# Patient Record
Sex: Female | Born: 1970 | Race: White | Hispanic: No | Marital: Single | Smoking: Former smoker
Health system: Southern US, Community
[De-identification: ages and names within clinical notes are randomized; demographics above are authoritative.]

## PROBLEM LIST (undated history)

## (undated) DIAGNOSIS — F419 Anxiety disorder, unspecified: Secondary | ICD-10-CM

## (undated) DIAGNOSIS — F341 Dysthymic disorder: Secondary | ICD-10-CM

## (undated) DIAGNOSIS — IMO0002 Reserved for concepts with insufficient information to code with codable children: Secondary | ICD-10-CM

## (undated) HISTORY — DX: Reserved for concepts with insufficient information to code with codable children: IMO0002

## (undated) HISTORY — DX: Dysthymic disorder: F34.1

## (undated) HISTORY — PX: TONSILLECTOMY: SUR1361

---

## 1998-04-03 ENCOUNTER — Encounter: Payer: Self-pay | Admitting: Emergency Medicine

## 1998-04-03 ENCOUNTER — Emergency Department (HOSPITAL_COMMUNITY): Admission: EM | Admit: 1998-04-03 | Discharge: 1998-04-03 | Payer: Self-pay | Admitting: Emergency Medicine

## 2003-07-23 ENCOUNTER — Emergency Department (HOSPITAL_COMMUNITY): Admission: EM | Admit: 2003-07-23 | Discharge: 2003-07-23 | Payer: Self-pay | Admitting: Family Medicine

## 2007-06-28 ENCOUNTER — Emergency Department (HOSPITAL_COMMUNITY): Admission: EM | Admit: 2007-06-28 | Discharge: 2007-06-28 | Payer: Self-pay | Admitting: Emergency Medicine

## 2007-08-16 ENCOUNTER — Ambulatory Visit: Payer: Self-pay | Admitting: Gastroenterology

## 2010-07-16 ENCOUNTER — Emergency Department (HOSPITAL_COMMUNITY)
Admission: EM | Admit: 2010-07-16 | Discharge: 2010-07-16 | Disposition: A | Discharge status: Home / Self Care | Payer: Medicaid Other | Attending: Emergency Medicine | Admitting: Emergency Medicine

## 2010-07-16 DIAGNOSIS — R109 Unspecified abdominal pain: Secondary | ICD-10-CM | POA: Insufficient documentation

## 2010-07-16 DIAGNOSIS — K219 Gastro-esophageal reflux disease without esophagitis: Secondary | ICD-10-CM | POA: Insufficient documentation

## 2010-07-16 LAB — DIFFERENTIAL
Basophils Absolute: 0 10*3/uL (ref 0.0–0.1)
Basophils Relative: 0 % (ref 0–1)
Eosinophils Absolute: 0.1 10*3/uL (ref 0.0–0.7)
Eosinophils Relative: 2 % (ref 0–5)
Lymphocytes Relative: 24 % (ref 12–46)
Lymphs Abs: 1.7 10*3/uL (ref 0.7–4.0)
Monocytes Absolute: 0.5 10*3/uL (ref 0.1–1.0)
Monocytes Relative: 7 % (ref 3–12)
Neutro Abs: 4.8 10*3/uL (ref 1.7–7.7)
Neutrophils Relative %: 67 % (ref 43–77)

## 2010-07-16 LAB — CBC
HCT: 31.3 % — ABNORMAL LOW (ref 36.0–46.0)
Hemoglobin: 10.4 g/dL — ABNORMAL LOW (ref 12.0–15.0)
MCH: 28 pg (ref 26.0–34.0)
MCHC: 33.2 g/dL (ref 30.0–36.0)
MCV: 84.1 fL (ref 78.0–100.0)
Platelets: 241 10*3/uL (ref 150–400)
RBC: 3.72 MIL/uL — ABNORMAL LOW (ref 3.87–5.11)
RDW: 14.3 % (ref 11.5–15.5)
WBC: 7.1 10*3/uL (ref 4.0–10.5)

## 2010-07-16 LAB — COMPREHENSIVE METABOLIC PANEL
Albumin: 3.4 g/dL — ABNORMAL LOW (ref 3.5–5.2)
BUN: 12 mg/dL (ref 6–23)
Chloride: 106 mEq/L (ref 96–112)
Creatinine, Ser: 0.66 mg/dL (ref 0.4–1.2)
Total Bilirubin: <0.1 mg/dL — ABNORMAL LOW (ref 0.3–1.2)
Total Protein: 6.3 g/dL (ref 6.0–8.3)

## 2010-07-16 LAB — POCT CARDIAC MARKERS
CKMB, poc: <1 ng/mL — ABNORMAL LOW (ref 1.0–8.0)
Myoglobin, poc: 36.4 ng/mL (ref 12–200)
Troponin i, poc: <0.05 ng/mL (ref 0.00–0.09)

## 2010-07-29 LAB — ABO/RH: RH Type: NEGATIVE

## 2010-07-29 LAB — RPR: RPR: NONREACTIVE

## 2010-07-29 LAB — HIV ANTIBODY (ROUTINE TESTING W REFLEX): HIV: NONREACTIVE

## 2010-11-15 LAB — HIV ANTIBODY (ROUTINE TESTING W REFLEX): HIV: NONREACTIVE

## 2010-11-15 LAB — RPR: RPR: NONREACTIVE

## 2011-01-31 ENCOUNTER — Other Ambulatory Visit (HOSPITAL_COMMUNITY): Payer: Self-pay | Admitting: Obstetrics and Gynecology

## 2011-02-02 ENCOUNTER — Ambulatory Visit (HOSPITAL_COMMUNITY)
Admission: RE | Admit: 2011-02-02 | Discharge: 2011-02-02 | Disposition: A | Discharge status: Home / Self Care | Payer: Medicaid Other | Source: Ambulatory Visit | Attending: Obstetrics and Gynecology | Admitting: Obstetrics and Gynecology

## 2011-02-02 DIAGNOSIS — O365990 Maternal care for other known or suspected poor fetal growth, unspecified trimester, not applicable or unspecified: Secondary | ICD-10-CM | POA: Insufficient documentation

## 2011-02-02 DIAGNOSIS — O09529 Supervision of elderly multigravida, unspecified trimester: Secondary | ICD-10-CM | POA: Insufficient documentation

## 2011-02-10 ENCOUNTER — Inpatient Hospital Stay (HOSPITAL_COMMUNITY)
Admission: AD | Admit: 2011-02-10 | Discharge: 2011-02-10 | Disposition: A | Discharge status: Home / Self Care | Payer: Medicaid Other | Source: Ambulatory Visit | Attending: Obstetrics and Gynecology | Admitting: Obstetrics and Gynecology

## 2011-02-10 ENCOUNTER — Encounter (HOSPITAL_COMMUNITY): Payer: Self-pay | Admitting: *Deleted

## 2011-02-10 DIAGNOSIS — O479 False labor, unspecified: Secondary | ICD-10-CM | POA: Insufficient documentation

## 2011-02-10 HISTORY — DX: Anxiety disorder, unspecified: F41.9

## 2011-02-10 NOTE — Progress Notes (Signed)
I've been contracting off and on all day. Had NST in office and had some ctxs then. Have gotten closer and alittle stronger last hr or so

## 2011-02-10 NOTE — Progress Notes (Signed)
2150 Dr Ellyn Hack notified of pt's admission and status. Aware of ctx pattern, sve, hx NSTs due to baby measuring small but u/s ok. Pt stable for d/c home

## 2011-02-10 NOTE — Progress Notes (Signed)
Written and verbal d/c instructions given for false labor and understanding voiced

## 2011-02-13 ENCOUNTER — Inpatient Hospital Stay (HOSPITAL_COMMUNITY)
Admission: AD | Admit: 2011-02-13 | Discharge: 2011-02-13 | Disposition: A | Discharge status: Home / Self Care | Payer: Medicaid Other | Source: Ambulatory Visit | Attending: Obstetrics and Gynecology | Admitting: Obstetrics and Gynecology

## 2011-02-13 ENCOUNTER — Encounter (HOSPITAL_COMMUNITY): Payer: Self-pay

## 2011-02-13 DIAGNOSIS — O479 False labor, unspecified: Secondary | ICD-10-CM | POA: Insufficient documentation

## 2011-02-13 NOTE — Progress Notes (Signed)
Pt states contracting 5-6 minutes apart starting at 1900 tonight. States they are not as frequent or as strong since she;s been here in [facility].

## 2011-02-13 NOTE — Progress Notes (Signed)
Pt reports contractions, G2P1, denies problems with preg. Denies bleeding or ROM

## 2011-02-16 LAB — DIFFERENTIAL
Basophils Absolute: 0
Eosinophils Absolute: 0.1
Eosinophils Relative: 1
Monocytes Absolute: 0.4

## 2011-02-16 LAB — COMPREHENSIVE METABOLIC PANEL
ALT: 16
AST: 16
Albumin: 3.9
CO2: 26
Chloride: 109
GFR calc Af Amer: >60
GFR calc non Af Amer: >60
Potassium: 4.4
Sodium: 140
Total Bilirubin: 0.8

## 2011-02-16 LAB — URINALYSIS, ROUTINE W REFLEX MICROSCOPIC
Hgb urine dipstick: NEGATIVE
Nitrite: NEGATIVE
Specific Gravity, Urine: 1.019
Urobilinogen, UA: 1

## 2011-02-16 LAB — CBC
RBC: 4.42
WBC: 6.8

## 2011-02-16 LAB — LIPASE, BLOOD: Lipase: 22

## 2011-02-17 ENCOUNTER — Telehealth (HOSPITAL_COMMUNITY): Payer: Self-pay | Admitting: *Deleted

## 2011-02-17 ENCOUNTER — Encounter (HOSPITAL_COMMUNITY): Payer: Self-pay | Admitting: *Deleted

## 2011-02-17 NOTE — Telephone Encounter (Signed)
Preadmission screen  

## 2011-02-20 ENCOUNTER — Encounter (HOSPITAL_COMMUNITY): Payer: Self-pay | Admitting: *Deleted

## 2011-02-20 ENCOUNTER — Other Ambulatory Visit: Payer: Self-pay | Admitting: Obstetrics and Gynecology

## 2011-02-21 ENCOUNTER — Other Ambulatory Visit: Payer: Self-pay | Admitting: Obstetrics and Gynecology

## 2011-02-21 ENCOUNTER — Inpatient Hospital Stay (HOSPITAL_COMMUNITY): Payer: Medicaid Other | Admitting: Anesthesiology

## 2011-02-21 ENCOUNTER — Encounter (HOSPITAL_COMMUNITY)
Admission: RE | Disposition: A | Discharge status: Home / Self Care | Payer: Self-pay | Source: Ambulatory Visit | Attending: Obstetrics and Gynecology

## 2011-02-21 ENCOUNTER — Encounter (HOSPITAL_COMMUNITY): Payer: Self-pay

## 2011-02-21 ENCOUNTER — Encounter (HOSPITAL_COMMUNITY): Payer: Self-pay | Admitting: Anesthesiology

## 2011-02-21 ENCOUNTER — Inpatient Hospital Stay (HOSPITAL_COMMUNITY)
Admission: RE | Admit: 2011-02-21 | Discharge: 2011-02-23 | DRG: 767 | Disposition: A | Discharge status: Home / Self Care | Payer: Medicaid Other | Source: Ambulatory Visit | Attending: Obstetrics and Gynecology | Admitting: Obstetrics and Gynecology

## 2011-02-21 VITALS — BP 148/88 | HR 76 | Temp 97.8°F | Resp 18 | Ht 71.0 in | Wt 174.0 lb

## 2011-02-21 DIAGNOSIS — N909 Noninflammatory disorder of vulva and perineum, unspecified: Secondary | ICD-10-CM | POA: Diagnosis present

## 2011-02-21 DIAGNOSIS — Z9851 Tubal ligation status: Secondary | ICD-10-CM

## 2011-02-21 DIAGNOSIS — O99892 Other specified diseases and conditions complicating childbirth: Principal | ICD-10-CM | POA: Diagnosis present

## 2011-02-21 DIAGNOSIS — Z302 Encounter for sterilization: Secondary | ICD-10-CM

## 2011-02-21 HISTORY — PX: TUBAL LIGATION: SHX77

## 2011-02-21 LAB — RPR: RPR Ser Ql: NONREACTIVE

## 2011-02-21 LAB — ABO/RH: ABO/RH(D): A NEG

## 2011-02-21 LAB — SURGICAL PCR SCREEN
MRSA, PCR: NEGATIVE
Staphylococcus aureus: POSITIVE — AB

## 2011-02-21 LAB — CBC
MCH: 25.8 pg — ABNORMAL LOW (ref 26.0–34.0)
MCHC: 33.2 g/dL (ref 30.0–36.0)
Platelets: 327 10*3/uL (ref 150–400)
RDW: 15.2 % (ref 11.5–15.5)

## 2011-02-21 SURGERY — LIGATION, FALLOPIAN TUBE, POSTPARTUM
Anesthesia: Spinal | Site: Abdomen | Laterality: Bilateral | Wound class: Clean

## 2011-02-21 MED ORDER — LIDOCAINE HCL (PF) 1 % IJ SOLN
30.0000 mL | INTRAMUSCULAR | Status: DC | PRN
  Administered 2011-02-21: 30 mL via SUBCUTANEOUS
  Filled 2011-02-21: qty 30

## 2011-02-21 MED ORDER — INFLUENZA VIRUS VACC SPLIT PF IM SUSP
0.5000 mL | Freq: Once | INTRAMUSCULAR | Status: AC
  Administered 2011-02-22: 0.5 mL via INTRAMUSCULAR
  Filled 2011-02-21: qty 0.5

## 2011-02-21 MED ORDER — BUTORPHANOL TARTRATE 2 MG/ML IJ SOLN
INTRAMUSCULAR | Status: AC
  Administered 2011-02-21: 1 mg via INTRAVENOUS
  Filled 2011-02-21: qty 1

## 2011-02-21 MED ORDER — PHENYLEPHRINE 40 MCG/ML (10ML) SYRINGE FOR IV PUSH (FOR BLOOD PRESSURE SUPPORT): 80.0000 ug | PREFILLED_SYRINGE | INTRAVENOUS | Status: DC | PRN

## 2011-02-21 MED ORDER — INFLUENZA VIRUS VACC SPLIT PF IM SUSP
0.5000 mL | INTRAMUSCULAR | Status: AC | PRN
  Administered 2011-02-22: 0.5 mL via INTRAMUSCULAR

## 2011-02-21 MED ORDER — LACTATED RINGERS IV SOLN
INTRAVENOUS | Status: DC
  Administered 2011-02-21 (×2): via INTRAVENOUS

## 2011-02-21 MED ORDER — TETANUS-DIPHTH-ACELL PERTUSSIS 5-2.5-18.5 LF-MCG/0.5 IM SUSP
0.5000 mL | Freq: Once | INTRAMUSCULAR | Status: AC
  Administered 2011-02-22: 0.5 mL via INTRAMUSCULAR
  Filled 2011-02-21 (×2): qty 0.5

## 2011-02-21 MED ORDER — LANOLIN HYDROUS EX OINT: TOPICAL_OINTMENT | CUTANEOUS | Status: DC | PRN

## 2011-02-21 MED ORDER — LACTATED RINGERS IV SOLN: 500.0000 mL | Freq: Once | INTRAVENOUS | Status: DC

## 2011-02-21 MED ORDER — BUTORPHANOL TARTRATE 2 MG/ML IJ SOLN
1.0000 mg | Freq: Once | INTRAMUSCULAR | Status: AC
  Administered 2011-02-21: 1 mg via INTRAVENOUS

## 2011-02-21 MED ORDER — PHENYLEPHRINE 40 MCG/ML (10ML) SYRINGE FOR IV PUSH (FOR BLOOD PRESSURE SUPPORT)
PREFILLED_SYRINGE | INTRAVENOUS | Status: AC
  Filled 2011-02-21: qty 5

## 2011-02-21 MED ORDER — METOCLOPRAMIDE HCL 10 MG PO TABS
10.0000 mg | ORAL_TABLET | Freq: Once | ORAL | Status: AC
  Administered 2011-02-21: 10 mg via ORAL
  Filled 2011-02-21: qty 1

## 2011-02-21 MED ORDER — TERBUTALINE SULFATE 1 MG/ML IJ SOLN: 0.2500 mg | Freq: Once | INTRAMUSCULAR | Status: DC | PRN

## 2011-02-21 MED ORDER — NALOXONE HCL 0.4 MG/ML IJ SOLN
INTRAMUSCULAR | Status: AC
  Filled 2011-02-21: qty 1

## 2011-02-21 MED ORDER — ONDANSETRON HCL 4 MG/2ML IJ SOLN
INTRAMUSCULAR | Status: AC
  Filled 2011-02-21: qty 2

## 2011-02-21 MED ORDER — FAMOTIDINE 20 MG PO TABS
40.0000 mg | ORAL_TABLET | Freq: Once | ORAL | Status: AC
  Administered 2011-02-21: 40 mg via ORAL
  Filled 2011-02-21: qty 2

## 2011-02-21 MED ORDER — DIBUCAINE 1 % RE OINT
1.0000 "application " | TOPICAL_OINTMENT | RECTAL | Status: DC | PRN
  Administered 2011-02-21: 1 via RECTAL
  Filled 2011-02-21 (×2): qty 28

## 2011-02-21 MED ORDER — BENZOCAINE-MENTHOL 20-0.5 % EX AERO
1.0000 "application " | INHALATION_SPRAY | CUTANEOUS | Status: DC | PRN
  Administered 2011-02-21: 1 via TOPICAL
  Filled 2011-02-21: qty 56

## 2011-02-21 MED ORDER — MIDAZOLAM HCL 2 MG/2ML IJ SOLN
INTRAMUSCULAR | Status: AC
  Filled 2011-02-21: qty 2

## 2011-02-21 MED ORDER — LACTATED RINGERS IV SOLN
INTRAVENOUS | Status: DC
  Administered 2011-02-21: 999 mL/h via INTRAVENOUS
  Administered 2011-02-21: 125 mL/h via INTRAVENOUS

## 2011-02-21 MED ORDER — ONDANSETRON HCL 4 MG/2ML IJ SOLN: 4.0000 mg | INTRAMUSCULAR | Status: DC | PRN

## 2011-02-21 MED ORDER — SIMETHICONE 80 MG PO CHEW: 80.0000 mg | CHEWABLE_TABLET | ORAL | Status: DC | PRN

## 2011-02-21 MED ORDER — MEPERIDINE HCL 25 MG/ML IJ SOLN: 6.2500 mg | INTRAMUSCULAR | Status: DC | PRN

## 2011-02-21 MED ORDER — DIPHENHYDRAMINE HCL 50 MG/ML IJ SOLN: 12.5000 mg | INTRAMUSCULAR | Status: DC | PRN

## 2011-02-21 MED ORDER — BUPIVACAINE IN DEXTROSE 0.75-8.25 % IT SOLN
INTRATHECAL | Status: DC | PRN
  Administered 2011-02-21: 15 mg via INTRATHECAL

## 2011-02-21 MED ORDER — EPHEDRINE 5 MG/ML INJ: 10.0000 mg | INTRAVENOUS | Status: DC | PRN

## 2011-02-21 MED ORDER — METOCLOPRAMIDE HCL 5 MG/ML IJ SOLN: 10.0000 mg | Freq: Once | INTRAMUSCULAR | Status: DC | PRN

## 2011-02-21 MED ORDER — FENTANYL CITRATE 0.05 MG/ML IJ SOLN: 25.0000 ug | INTRAMUSCULAR | Status: DC | PRN

## 2011-02-21 MED ORDER — OXYTOCIN BOLUS FROM INFUSION
500.0000 mL | Freq: Once | INTRAVENOUS | Status: DC
  Filled 2011-02-21: qty 500

## 2011-02-21 MED ORDER — IBUPROFEN 600 MG PO TABS
600.0000 mg | ORAL_TABLET | Freq: Four times a day (QID) | ORAL | Status: DC
  Administered 2011-02-21 – 2011-02-23 (×8): 600 mg via ORAL
  Filled 2011-02-21 (×8): qty 1

## 2011-02-21 MED ORDER — WITCH HAZEL-GLYCERIN EX PADS
1.0000 "application " | MEDICATED_PAD | CUTANEOUS | Status: DC | PRN
  Administered 2011-02-21: 1 via TOPICAL

## 2011-02-21 MED ORDER — OXYTOCIN 20 UNITS IN LACTATED RINGERS INFUSION - SIMPLE
1.0000 m[IU]/min | INTRAVENOUS | Status: DC
  Administered 2011-02-21: 4 m[IU]/min via INTRAVENOUS
  Administered 2011-02-21: 2 m[IU]/min via INTRAVENOUS
  Filled 2011-02-21: qty 1000

## 2011-02-21 MED ORDER — FLEET ENEMA 7-19 GM/118ML RE ENEM: 1.0000 | ENEMA | RECTAL | Status: DC | PRN

## 2011-02-21 MED ORDER — FENTANYL 2.5 MCG/ML BUPIVACAINE 1/10 % EPIDURAL INFUSION (WH - ANES): 14.0000 mL/h | INTRAMUSCULAR | Status: DC

## 2011-02-21 MED ORDER — ACETAMINOPHEN 325 MG PO TABS: 650.0000 mg | ORAL_TABLET | ORAL | Status: DC | PRN

## 2011-02-21 MED ORDER — PRENATAL PLUS 27-1 MG PO TABS
1.0000 | ORAL_TABLET | Freq: Every day | ORAL | Status: DC
  Administered 2011-02-22 – 2011-02-23 (×2): 1 via ORAL
  Filled 2011-02-21 (×2): qty 1

## 2011-02-21 MED ORDER — ONDANSETRON HCL 4 MG/2ML IJ SOLN: 4.0000 mg | Freq: Four times a day (QID) | INTRAMUSCULAR | Status: DC | PRN

## 2011-02-21 MED ORDER — MIDAZOLAM HCL 5 MG/5ML IJ SOLN
INTRAMUSCULAR | Status: DC | PRN
  Administered 2011-02-21: 2 mg via INTRAVENOUS

## 2011-02-21 MED ORDER — OXYTOCIN 20 UNITS IN LACTATED RINGERS INFUSION - SIMPLE: 125.0000 mL/h | Freq: Once | INTRAVENOUS | Status: DC

## 2011-02-21 MED ORDER — ZOLPIDEM TARTRATE 5 MG PO TABS: 5.0000 mg | ORAL_TABLET | Freq: Every evening | ORAL | Status: DC | PRN

## 2011-02-21 MED ORDER — EPHEDRINE 5 MG/ML INJ
10.0000 mg | INTRAVENOUS | Status: DC | PRN
  Filled 2011-02-21: qty 4

## 2011-02-21 MED ORDER — FENTANYL CITRATE 0.05 MG/ML IJ SOLN
INTRAMUSCULAR | Status: DC | PRN
  Administered 2011-02-21 (×2): 50 ug via INTRAVENOUS

## 2011-02-21 MED ORDER — ONDANSETRON HCL 4 MG PO TABS: 4.0000 mg | ORAL_TABLET | ORAL | Status: DC | PRN

## 2011-02-21 MED ORDER — SENNOSIDES-DOCUSATE SODIUM 8.6-50 MG PO TABS
2.0000 | ORAL_TABLET | Freq: Every day | ORAL | Status: DC
  Administered 2011-02-22: 2 via ORAL

## 2011-02-21 MED ORDER — FENTANYL CITRATE 0.05 MG/ML IJ SOLN
INTRAMUSCULAR | Status: AC
  Filled 2011-02-21: qty 2

## 2011-02-21 MED ORDER — OXYCODONE-ACETAMINOPHEN 5-325 MG PO TABS
1.0000 | ORAL_TABLET | ORAL | Status: DC | PRN
  Administered 2011-02-21: 1 via ORAL
  Filled 2011-02-21: qty 1

## 2011-02-21 MED ORDER — FENTANYL 2.5 MCG/ML BUPIVACAINE 1/10 % EPIDURAL INFUSION (WH - ANES)
INTRAMUSCULAR | Status: AC
  Filled 2011-02-21: qty 60

## 2011-02-21 MED ORDER — IBUPROFEN 600 MG PO TABS: 600.0000 mg | ORAL_TABLET | Freq: Four times a day (QID) | ORAL | Status: DC | PRN

## 2011-02-21 MED ORDER — DIPHENHYDRAMINE HCL 25 MG PO CAPS: 25.0000 mg | ORAL_CAPSULE | Freq: Four times a day (QID) | ORAL | Status: DC | PRN

## 2011-02-21 MED ORDER — BENZOCAINE-MENTHOL 20-0.5 % EX AERO
INHALATION_SPRAY | CUTANEOUS | Status: AC
  Administered 2011-02-21: 1 via TOPICAL
  Filled 2011-02-21: qty 56

## 2011-02-21 MED ORDER — CITRIC ACID-SODIUM CITRATE 334-500 MG/5ML PO SOLN: 30.0000 mL | ORAL | Status: DC | PRN

## 2011-02-21 MED ORDER — LACTATED RINGERS IV SOLN: 500.0000 mL | INTRAVENOUS | Status: DC | PRN

## 2011-02-21 MED ORDER — EPHEDRINE 5 MG/ML INJ
INTRAVENOUS | Status: AC
  Filled 2011-02-21: qty 4

## 2011-02-21 MED ORDER — OXYCODONE-ACETAMINOPHEN 5-325 MG PO TABS: 2.0000 | ORAL_TABLET | ORAL | Status: DC | PRN

## 2011-02-21 MED ORDER — ONDANSETRON HCL 4 MG/2ML IJ SOLN
INTRAMUSCULAR | Status: DC | PRN
  Administered 2011-02-21: 4 mg via INTRAVENOUS

## 2011-02-21 SURGICAL SUPPLY — 22 items
CLOTH BEACON ORANGE TIMEOUT ST (SAFETY) ×2 IMPLANT
CONTAINER PREFILL 10% NBF 15ML (MISCELLANEOUS) ×4 IMPLANT
DRSG COVADERM PLUS 2X2 (GAUZE/BANDAGES/DRESSINGS) ×2 IMPLANT
ELECT REM PT RETURN 9FT ADLT (ELECTROSURGICAL) ×2
ELECTRODE REM PT RTRN 9FT ADLT (ELECTROSURGICAL) ×1 IMPLANT
GLOVE BIO SURGEON STRL SZ7.5 (GLOVE) ×4 IMPLANT
GOWN PREVENTION PLUS LG XLONG (DISPOSABLE) ×2 IMPLANT
GOWN PREVENTION PLUS XLARGE (GOWN DISPOSABLE) ×2 IMPLANT
NS IRRIG 1000ML POUR BTL (IV SOLUTION) ×2 IMPLANT
PACK ABDOMINAL MINOR (CUSTOM PROCEDURE TRAY) ×2 IMPLANT
PENCIL BUTTON HOLSTER BLD 10FT (ELECTRODE) ×2 IMPLANT
SPONGE LAP 4X18 X RAY DECT (DISPOSABLE) ×2 IMPLANT
SUT PLAIN 0 NONE (SUTURE) ×4 IMPLANT
SUT PLAIN 2 0 (SUTURE)
SUT PLAIN 3 0 X 1 18 (SUTURE) ×2 IMPLANT
SUT PLAIN ABS 2-0 54XMFL TIE (SUTURE) IMPLANT
SUT VIC AB 0 CT1 27 (SUTURE) ×1
SUT VIC AB 0 CT1 27XBRD ANBCTR (SUTURE) ×1 IMPLANT
SUT VIC AB 3-0 CTX 36 (SUTURE) IMPLANT
TOWEL OR 17X24 6PK STRL BLUE (TOWEL DISPOSABLE) ×4 IMPLANT
TRAY FOLEY CATH 14FR (SET/KITS/TRAYS/PACK) ×2 IMPLANT
WATER STERILE IRR 1000ML POUR (IV SOLUTION) ×2 IMPLANT

## 2011-02-21 NOTE — Anesthesia Postprocedure Evaluation (Signed)
Anesthesia Post Note  Patient: Savannah Webb  Procedure(s) Performed:  POST PARTUM TUBAL LIGATION  Anesthesia type: Spinal  Patient location: PACU  Post pain: Pain level controlled  Post assessment: Post-op Vital signs reviewed  Last Vitals:  Filed Vitals:   02/21/11 1930  BP: 116/67  Pulse: 94  Temp:   Resp:     Post vital signs: Reviewed  Level of consciousness: awake  Complications: No apparent anesthesia complications

## 2011-02-21 NOTE — Progress Notes (Signed)
Patient reached full dilatation and pushed poorly. Finally, a LML episiotomy was done and with one contraction she delivered a living fermale infant 7 pounds 0 ounces OP. The placenta was removed intact and the uterus was normal. The pt requested removal of 3 lesions on the left labium majus.1% xylocaine was used for the LML episiotomy and the left labial lesions. The LML episiotomy was repaired with 3-0 vicryl. EBL 500 cc's.

## 2011-02-21 NOTE — Anesthesia Preprocedure Evaluation (Signed)
Anesthesia Evaluation  Name, MR# and DOB Patient awake  General Assessment Comment  Reviewed: Allergy & Precautions, H&P , Patient's Chart, lab work & pertinent test results  Airway Mallampati: II TM Distance: >3 FB Neck ROM: full    Dental No notable dental hx. (+) Teeth Intact   Pulmonary  clear to auscultation  pulmonary exam normalPulmonary Exam Normal breath sounds clear to auscultation none    Cardiovascular regular Normal    Neuro/Psych    (+) Anxiety,  Negative Neurological ROS  Negative Psych ROS  GI/Hepatic/Renal negative GI ROS  negative Liver ROS  negative Renal ROS        Endo/Other  Negative Endocrine ROS (+)      Abdominal Normal abdominal exam  (+)   Musculoskeletal negative musculoskeletal ROS (+)   Hematology negative hematology ROS (+)   Peds  Reproductive/Obstetrics negative OB ROS    Anesthesia Other Findings             Anesthesia Physical  Anesthesia Plan  ASA: II  Anesthesia Plan: Epidural   Post-op Pain Management:    Induction:   Airway Management Planned:   Additional Equipment:   Intra-op Plan:   Post-operative Plan:   Informed Consent: I have reviewed the patients History and Physical, chart, labs and discussed the procedure including the risks, benefits and alternatives for the proposed anesthesia with the patient or authorized representative who has indicated his/her understanding and acceptance.   Dental advisory given  Plan Discussed with: Anesthesiologist and CRNA  Anesthesia Plan Comments:         Anesthesia Quick Evaluation

## 2011-02-21 NOTE — Anesthesia Procedure Notes (Signed)
Spinal Block  Patient location during procedure: OR Start time: 02/21/2011 6:40 PM Staffing Anesthesiologist: Maeby Vankleeck A. Performed by: anesthesiologist  Preanesthetic Checklist Completed: patient identified, site marked, surgical consent, pre-op evaluation, timeout performed, IV checked, risks and benefits discussed and monitors and equipment checked Spinal Block Patient position: sitting Prep: site prepped and draped and DuraPrep Patient monitoring: heart rate, cardiac monitor, continuous pulse ox and blood pressure Approach: midline Location: L3-4 Injection technique: single-shot Needle Needle type: Sprotte  Needle gauge: 24 G Needle length: 9 cm Needle insertion depth: 6 cm Assessment Sensory level: T4 Additional Notes Patient tolerated procedure well. Sensory level at T6 to cold.

## 2011-02-21 NOTE — Anesthesia Preprocedure Evaluation (Deleted)
Anesthesia Evaluation  Name, MR# and DOB Patient awake  General Assessment Comment  Reviewed: Allergy & Precautions, H&P , Patient's Chart, lab work & pertinent test results  Airway Mallampati: II TM Distance: >3 FB Neck ROM: full    Dental No notable dental hx. (+) Teeth Intact   Pulmonary  clear to auscultation  pulmonary exam normalPulmonary Exam Normal breath sounds clear to auscultation none    Cardiovascular regular Normal    Neuro/Psych Negative Neurological ROS  Negative Psych ROS  GI/Hepatic/Renal negative GI ROS  negative Liver ROS  negative Renal ROS        Endo/Other  Negative Endocrine ROS (+)      Abdominal   Musculoskeletal   Hematology negative hematology ROS (+)   Peds  Reproductive/Obstetrics (+) Pregnancy    Anesthesia Other Findings            Anesthesia Physical Anesthesia Plan  ASA: II  Anesthesia Plan: Epidural   Post-op Pain Management:    Induction:   Airway Management Planned:   Additional Equipment:   Intra-op Plan:   Post-operative Plan:   Informed Consent: I have reviewed the patients History and Physical, chart, labs and discussed the procedure including the risks, benefits and alternatives for the proposed anesthesia with the patient or authorized representative who has indicated his/her understanding and acceptance.     Plan Discussed with: Anesthesiologist  Anesthesia Plan Comments:         Anesthesia Quick Evaluation  

## 2011-02-21 NOTE — H&P (Signed)
Dr. Ambrose Mantle dictating an admission history and physical exam on Savannah Webb:  This is a 40 year old white female para 1001 gravida 2 EDC 02/25/2011 based on ultrasound on 07/27/2010 at 9 weeks and 6 days.  Blood group and type is A- negative antibody, Pap smear normal, rubella non-immune, urine culture negative, hepatitis B surface antigen negative, HIV negative, GC and Chlamydia negative first trimester screen and AFP negative cystic fibrosis screen negative one hour Glucola 78 group B strep negative  The patient's prenatal course has been uncomplicated she has requested tubal ligation postpartum and I will check with her to see if she is signed her papers. She is now at 39+ weeks gestation the cervix was favorable and she is admitted for induction of labor  Past medical history allergies: None known  Operations tonsillectomy 2006  Medical history: Generalized anxiety disorder depression panic disorder alcohol tobacco and drugs: None  Family history: Maternal grandmother heart disease and colon cancer, mother high blood pressure uncle high blood pressure and diabetes  Obstetric history: 12/25/1995 7 lbs. 2 oz. Female infant vaginally with an epidural.  Medications: Prenatal vitamins  Physical exam: Blood pressure 121/68 pulse 68 respirations 20  Heart: Normal sinus rhythm no murmurs  Lungs: Clear to auscultation  Abdomen: Fundal height 36 cm fetal heart tones normal  Cervix 2 cm, 50% effaced, vertex at a -3  Impression: Intrauterine pregnancy 39+ weeks  Disposition: IV Pitocin, observe for progress in labor

## 2011-02-21 NOTE — Transfer of Care (Signed)
Immediate Anesthesia Transfer of Care Note  Patient: Savannah Webb  Procedure(s) Performed:  POST PARTUM TUBAL LIGATION  Patient Location: PACU  Anesthesia Type: Spinal  Level of Consciousness: awake, alert , oriented and patient cooperative  Airway & Oxygen Therapy: Patient Spontanous Breathing  Post-op Assessment: Report given to PACU RN and Post -op Vital signs reviewed and stable  Post vital signs: Reviewed and stable  Complications: No apparent anesthesia complications

## 2011-02-21 NOTE — Progress Notes (Signed)
Pitocin is at 4 mu/ minute and the contractions are q 3 minutes but not painful. Cervix 2 cm 50 % effaced and the vertex is at - 2/-3 station. AROM produced clear fluid.

## 2011-02-21 NOTE — Progress Notes (Signed)
#  1 afebrile BP slightly elevated HGB stable no complaints.

## 2011-02-22 LAB — CBC
MCH: 24.9 pg — ABNORMAL LOW (ref 26.0–34.0)
MCHC: 31.7 g/dL (ref 30.0–36.0)
Platelets: 325 10*3/uL (ref 150–400)
RDW: 15.4 % (ref 11.5–15.5)

## 2011-02-22 MED ORDER — RHO D IMMUNE GLOBULIN 1500 UNIT/2ML IJ SOLN
300.0000 ug | Freq: Once | INTRAMUSCULAR | Status: AC
  Administered 2011-02-22: 300 ug via INTRAMUSCULAR
  Filled 2011-02-22: qty 2

## 2011-02-22 NOTE — Progress Notes (Signed)
Encounter addended by: Madison Hickman on: 02/22/2011 12:18 PM<BR>     Documentation filed: Notes Section

## 2011-02-22 NOTE — Progress Notes (Signed)
UR chart review completed.  

## 2011-02-22 NOTE — Anesthesia Postprocedure Evaluation (Deleted)
  Anesthesia Post-op Note  Patient: Savannah Webb  Procedure(s) Performed: * None*  Patient Location: Women[facility] Unit  Anesthesia Type: Patient was fully dilated. No procedure performed

## 2011-02-22 NOTE — Progress Notes (Signed)
Patient states she has been having slightly blurry vision and seeing a few spots for about five minutes and complains of mild headache for an hour. Patient states headache decreased with turning down the lights and resting.  No other symptoms present.  BP 136/85.   Dr. Jackelyn Knife notified with no new orders given.  Will continue to monitor pt closely.  Savannah Webb, [name]

## 2011-02-22 NOTE — Anesthesia Postprocedure Evaluation (Signed)
  Anesthesia Post-op Note  Patient: Savannah Webb  Procedure(s) Performed:  POST PARTUM TUBAL LIGATION  Patient Location: PACU and Women's Unit  Anesthesia Type: General  Level of Consciousness: awake, alert  and oriented  Airway and Oxygen Therapy: Patient Spontanous Breathing  Post-op Pain: none  Post-op Assessment: Post-op Vital signs reviewed and Patient's Cardiovascular Status Stable  Post-op Vital Signs: Reviewed and stable  Complications: No apparent anesthesia complications

## 2011-02-22 NOTE — Op Note (Signed)
NAMEMarland Kitchen  Savannah Webb, Savannah Webb NO.:  [id number]  MEDICAL RECORD NO.:  [id number]  LOCATION:  9101                          FACILITY:  [facility]  PHYSICIAN:  Malachi Pro. Ambrose Mantle, M.D. DATE OF BIRTH:  August 09, 1970  DATE OF PROCEDURE:  02/21/2011 DATE OF DISCHARGE:                              OPERATIVE REPORT   PREOPERATIVE DIAGNOSIS:  Voluntary sterilization.  POSTOPERATIVE DIAGNOSIS:  Voluntary sterilization.  OPERATION:  Bilateral tubal ligation.  OPERATOR:  Malachi Pro. Ambrose Mantle, MD, spinal anesthesia by Dr. Malen Gauze.  This patient was brought to the operating room after having been thoroughly counseled about the potential down side of doing a tubal ligation.  She had signed her sterilization forms.  She was given a spinal anesthetic by Dr. Malen Gauze, placed supine.  The abdomen was prepped with Betadine solution.  The urethra was prepped, and a Foley catheter was inserted to straight drain.  The abdomen was then draped as a sterile field.  Anesthesia was confirmed.  A semilunar incision was made in the inferior portion of the umbilicus through the skin.  The subcutaneous tissue was dissected to the fascia.  The fascia was opened and the peritoneum was entered without difficulty.  Each tube was identified and traced to its fimbriated end.  I could feel both ovaries, but I could not see them.  I identified both tubes and traced them to their fimbria, created a small window in the mesosalpinx placed two ties of 0 plain catgut proximally and distally on each tube, cut out the intervening section of tube.  There was no bleeding.  The suture was cut and the abdominal wall was closed with two interrupted figure-of-eight sutures of 0 Vicryl including the fascia, and the peritoneum, two sutures on the subcu tissue of 3-0 Vicryl, and the skin was reapproximated with 3-0 plain catgut.  The patient seemed to tolerate the procedure well.  Blood loss was no more than 2 or 3 mL.  Sponge and needle  counts were correct.  The patient was returned to recovery in satisfactory condition.     Malachi Pro. Ambrose Mantle, M.D.     TFH/MEDQ  D:  02/21/2011  T:  02/22/2011  Job:  [id number]

## 2011-02-23 LAB — RH IG WORKUP (INCLUDES ABO/RH)
ABO/RH(D): A NEG
Gestational Age(Wks): 39.3
Unit division: 0

## 2011-02-23 MED ORDER — IBUPROFEN 600 MG PO TABS: 600.0000 mg | ORAL_TABLET | Freq: Four times a day (QID) | ORAL | Status: AC | PRN

## 2011-02-23 MED ORDER — MEASLES, MUMPS & RUBELLA VAC ~~LOC~~ INJ
0.5000 mL | INJECTION | Freq: Once | SUBCUTANEOUS | Status: AC
  Administered 2011-02-23: 0.5 mL via SUBCUTANEOUS
  Filled 2011-02-23: qty 0.5

## 2011-02-23 NOTE — Progress Notes (Signed)
#  2 afebrile no problems for D/C.  

## 2011-02-23 NOTE — Discharge Summary (Signed)
NAMEMarland Kitchen  CHANTI, GOLUBSKI NO.:  [id number]  MEDICAL RECORD NO.:  [id number]  LOCATION:  9101                          FACILITY:  [facility]  PHYSICIAN:  Malachi Pro. Ambrose Mantle, M.D. DATE OF BIRTH:  01-04-1971  DATE OF ADMISSION:  02/21/2011 DATE OF DISCHARGE:  02/23/2011                              DISCHARGE SUMMARY   This is a 40 year old white female, para 1-0-0-1, gravida 2 admitted for induction of labor.  Blood group and type A negative, negative antibody, Pap smear normal, rubella nonimmune, urine culture negative, hepatitis B surface antigen negative, HIV negative, GC and Chlamydia negative, first trimester screen and AFP negative, cystic fibrosis negative, 1-hour Glucola was 79, group B strep negative.  The patient's prenatal course was essentially uncomplicated.  She did request tubal ligation after delivery.  She was admitted and placed on Pitocin.  She reached full dilatation, pushed very poorly.  Finally, a left mediolateral episiotomy was done and with one contraction the patient pushed out a 7-pound-0- ounce female infant, OP.  Placenta was removed intact.  Uterus was normal.  Repair of the left mediolateral episiotomy and removal of three labial lesions was done under local block.  Blood loss about 500 mL. The patient requested tubal ligation and underwent a tubal ligation by Dr. Ambrose Mantle under spinal anesthesia by Dr. Malen Gauze.  On the second postpartum day, the patient was afebrile, doing well, and was ready for discharge.  Initial hemoglobin was 9.2, hematocrit 27.7, white count 12,500, and platelet count 327,000.  Followup hemoglobin 9.1.  FINAL DIAGNOSES:  Intrauterine pregnancy, 39+ weeks, delivered occiput posterior.  OPERATIONS:  Spontaneous delivery, OP, repair of left mediolateral episiotomy, removal of three vulvar lesions, voluntary sterilization accomplished by tubal ligation.  FINAL CONDITION:  Improved.  INSTRUCTIONS:  Our regular discharge  instructions.  The patient is given a prescription for Motrin 600 mg, 30 tablets, 1 every 6 hours as needed for pain and she will be offered all of her vaccination.  She is to return in 2 weeks.     Malachi Pro. Ambrose Mantle, M.D.     TFH/MEDQ  D:  02/23/2011  T:  02/23/2011  Job:  [id number]

## 2011-02-27 ENCOUNTER — Encounter (HOSPITAL_COMMUNITY): Payer: Self-pay | Admitting: Obstetrics and Gynecology

## 2011-04-06 ENCOUNTER — Emergency Department (HOSPITAL_COMMUNITY): Payer: Medicaid Other

## 2011-04-06 ENCOUNTER — Encounter (HOSPITAL_COMMUNITY): Payer: Self-pay | Admitting: Adult Health

## 2011-04-06 ENCOUNTER — Emergency Department (HOSPITAL_COMMUNITY)
Admission: EM | Admit: 2011-04-06 | Discharge: 2011-04-06 | Disposition: A | Discharge status: Home / Self Care | Payer: Medicaid Other | Attending: Emergency Medicine | Admitting: Emergency Medicine

## 2011-04-06 DIAGNOSIS — M25579 Pain in unspecified ankle and joints of unspecified foot: Secondary | ICD-10-CM | POA: Insufficient documentation

## 2011-04-06 DIAGNOSIS — X500XXA Overexertion from strenuous movement or load, initial encounter: Secondary | ICD-10-CM | POA: Insufficient documentation

## 2011-04-06 DIAGNOSIS — S93409A Sprain of unspecified ligament of unspecified ankle, initial encounter: Secondary | ICD-10-CM | POA: Insufficient documentation

## 2011-04-06 DIAGNOSIS — S93401A Sprain of unspecified ligament of right ankle, initial encounter: Secondary | ICD-10-CM

## 2011-04-06 MED ORDER — IBUPROFEN 800 MG PO TABS: 800.0000 mg | ORAL_TABLET | Freq: Three times a day (TID) | ORAL | Status: DC

## 2011-04-06 MED ORDER — IBUPROFEN 800 MG PO TABS
ORAL_TABLET | ORAL | Status: AC
  Filled 2011-04-06: qty 1

## 2011-04-06 MED ORDER — ACETAMINOPHEN-CODEINE #3 300-30 MG PO TABS
1.0000 | ORAL_TABLET | Freq: Once | ORAL | Status: AC
  Administered 2011-04-06: 1 via ORAL

## 2011-04-06 MED ORDER — IBUPROFEN 800 MG PO TABS
800.0000 mg | ORAL_TABLET | Freq: Once | ORAL | Status: AC
  Administered 2011-04-06: 800 mg via ORAL

## 2011-04-06 MED ORDER — ACETAMINOPHEN-CODEINE #3 300-30 MG PO TABS
ORAL_TABLET | ORAL | Status: AC
  Filled 2011-04-06: qty 1

## 2011-04-06 MED ORDER — ACETAMINOPHEN-CODEINE #3 300-30 MG PO TABS: 1.0000 | ORAL_TABLET | Freq: Four times a day (QID) | ORAL | Status: AC | PRN

## 2011-04-06 MED ORDER — IBUPROFEN 800 MG PO TABS: 800.0000 mg | ORAL_TABLET | Freq: Three times a day (TID) | ORAL | Status: AC
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
## 2011-04-06 NOTE — ED Notes (Signed)
Larey Seat off a step ON HER PORCH. UNABLE TO BEAR WEIGHT ON ANKLE

## 2011-04-06 NOTE — Progress Notes (Signed)
Orthopedic Tech Progress Note Patient Details:  Savannah Webb Sep 29, 1970 [id number]       Tawni Carnes [facility] 04/06/2011, 5:26 PM

## 2011-04-07 NOTE — ED Provider Notes (Signed)
Evaluation and management procedures were performed by the PA/NP under my supervision/collaboration.    Dawson Hollman D Sennie Borden, MD 04/07/11 1435 

## 2011-10-06 ENCOUNTER — Other Ambulatory Visit: Payer: Self-pay | Admitting: Family Medicine

## 2011-10-06 DIAGNOSIS — E0500 Thyrotoxicosis with diffuse goiter without thyrotoxic crisis or storm: Secondary | ICD-10-CM

## 2011-10-10 ENCOUNTER — Ambulatory Visit
Admission: RE | Admit: 2011-10-10 | Discharge: 2011-10-10 | Disposition: A | Discharge status: Home / Self Care | Payer: Medicaid Other | Source: Ambulatory Visit | Attending: Family Medicine | Admitting: Family Medicine

## 2011-10-10 DIAGNOSIS — E0500 Thyrotoxicosis with diffuse goiter without thyrotoxic crisis or storm: Secondary | ICD-10-CM

## 2011-10-20 ENCOUNTER — Encounter: Payer: Self-pay | Admitting: Endocrinology

## 2011-10-20 ENCOUNTER — Ambulatory Visit (INDEPENDENT_AMBULATORY_CARE_PROVIDER_SITE_OTHER): Payer: Medicaid Other | Admitting: Endocrinology

## 2011-10-20 VITALS — BP 124/68 | HR 70 | Temp 98.2°F | Ht 71.0 in | Wt 128.0 lb

## 2011-10-20 DIAGNOSIS — E0590 Thyrotoxicosis, unspecified without thyrotoxic crisis or storm: Secondary | ICD-10-CM | POA: Insufficient documentation
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
## 2011-10-20 NOTE — Patient Instructions (Signed)

## 2011-10-21 ENCOUNTER — Encounter: Payer: Self-pay | Admitting: Endocrinology

## 2011-10-21 DIAGNOSIS — F341 Dysthymic disorder: Secondary | ICD-10-CM | POA: Insufficient documentation

## 2011-10-21 DIAGNOSIS — F419 Anxiety disorder, unspecified: Secondary | ICD-10-CM | POA: Insufficient documentation

## 2011-11-01 ENCOUNTER — Encounter (HOSPITAL_COMMUNITY)
Admission: RE | Admit: 2011-11-01 | Discharge: 2011-11-01 | Disposition: A | Discharge status: Home / Self Care | Payer: Medicaid Other | Source: Ambulatory Visit | Attending: Endocrinology | Admitting: Endocrinology

## 2011-11-01 DIAGNOSIS — E0590 Thyrotoxicosis, unspecified without thyrotoxic crisis or storm: Secondary | ICD-10-CM | POA: Insufficient documentation

## 2011-11-02 ENCOUNTER — Encounter (HOSPITAL_COMMUNITY)
Admission: RE | Admit: 2011-11-02 | Discharge: 2011-11-02 | Disposition: A | Discharge status: Home / Self Care | Payer: Medicaid Other | Source: Ambulatory Visit | Attending: Endocrinology | Admitting: Endocrinology

## 2011-11-02 MED ORDER — SODIUM PERTECHNETATE TC 99M INJECTION
10.0000 | Freq: Once | INTRAVENOUS | Status: AC | PRN
  Administered 2011-11-02: 10 via INTRAVENOUS

## 2011-11-02 MED ORDER — SODIUM IODIDE I 131 CAPSULE: 9.7000 | Freq: Once | INTRAVENOUS | Status: AC | PRN

## 2011-11-03 ENCOUNTER — Other Ambulatory Visit: Payer: Self-pay | Admitting: Endocrinology

## 2011-11-03 DIAGNOSIS — E0590 Thyrotoxicosis, unspecified without thyrotoxic crisis or storm: Secondary | ICD-10-CM

## 2011-11-06 ENCOUNTER — Telehealth: Payer: Self-pay

## 2011-11-06 NOTE — Telephone Encounter (Signed)
Pt called requesting results of thyroid scan and recommendations for treatment. Pt currently is without a phone and is requesting this information be emailed to her - [email or web address]

## 2011-11-06 NOTE — Telephone Encounter (Signed)
i ordered i-131 rx Ret 6 weeks later

## 2011-11-07 NOTE — Telephone Encounter (Signed)
Pt informed of results.

## 2011-11-07 NOTE — Telephone Encounter (Signed)
Left message for pt to callback office to inform of results.

## 2011-11-14 ENCOUNTER — Inpatient Hospital Stay (HOSPITAL_COMMUNITY): Admission: RE | Admit: 2011-11-14 | Payer: Medicaid Other | Source: Ambulatory Visit

## 2011-11-28 ENCOUNTER — Encounter (HOSPITAL_COMMUNITY)
Admission: RE | Admit: 2011-11-28 | Discharge: 2011-11-28 | Disposition: A | Discharge status: Home / Self Care | Payer: Medicaid Other | Source: Ambulatory Visit | Attending: Endocrinology | Admitting: Endocrinology

## 2011-11-28 DIAGNOSIS — E0590 Thyrotoxicosis, unspecified without thyrotoxic crisis or storm: Secondary | ICD-10-CM | POA: Insufficient documentation

## 2011-11-28 MED ORDER — SODIUM IODIDE I 131 CAPSULE
41.0000 | Freq: Once | INTRAVENOUS | Status: AC | PRN
  Administered 2011-11-28: 41 via ORAL

## 2012-01-22 ENCOUNTER — Other Ambulatory Visit (INDEPENDENT_AMBULATORY_CARE_PROVIDER_SITE_OTHER): Payer: Medicaid Other

## 2012-01-22 ENCOUNTER — Ambulatory Visit (INDEPENDENT_AMBULATORY_CARE_PROVIDER_SITE_OTHER): Payer: Medicaid Other | Admitting: Endocrinology

## 2012-01-22 ENCOUNTER — Encounter: Payer: Self-pay | Admitting: Endocrinology

## 2012-01-22 VITALS — BP 108/68 | HR 65 | Temp 96.9°F | Ht 71.0 in | Wt 126.0 lb

## 2012-01-22 DIAGNOSIS — E0590 Thyrotoxicosis, unspecified without thyrotoxic crisis or storm: Secondary | ICD-10-CM

## 2012-01-22 NOTE — Patient Instructions (Addendum)
blood tests are being requested for you today.  You will receive a letter with results.  Please come back for a follow-up appointment for 1 month. 
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
## 2012-01-23 ENCOUNTER — Encounter: Payer: Self-pay | Admitting: Endocrinology

## 2012-01-23 LAB — TSH: TSH: 0.04 u[IU]/mL — ABNORMAL LOW (ref 0.35–5.50)

## 2012-01-23 MED ORDER — LEVOTHYROXINE SODIUM 75 MCG PO TABS: 75.0000 ug | ORAL_TABLET | Freq: Every day | ORAL | Status: DC

## 2012-01-25 ENCOUNTER — Telehealth: Payer: Self-pay | Admitting: *Deleted

## 2012-01-25 NOTE — Telephone Encounter (Signed)
Called pt to inform of lab results, pt informed (letter also mailed to pt). 

## 2012-02-22 ENCOUNTER — Ambulatory Visit: Payer: Medicaid Other | Admitting: Endocrinology

## 2012-02-22 DIAGNOSIS — Z0289 Encounter for other administrative examinations: Secondary | ICD-10-CM

## 2014-03-30 ENCOUNTER — Encounter: Payer: Self-pay | Admitting: Endocrinology

## 2015-02-18 ENCOUNTER — Encounter (HOSPITAL_COMMUNITY): Payer: Self-pay | Admitting: Emergency Medicine

## 2015-02-18 ENCOUNTER — Emergency Department (HOSPITAL_COMMUNITY): Payer: Medicaid Other

## 2015-02-18 ENCOUNTER — Emergency Department (HOSPITAL_COMMUNITY)
Admission: EM | Admit: 2015-02-18 | Discharge: 2015-02-18 | Disposition: A | Discharge status: Home / Self Care | Payer: Medicaid Other | Attending: Emergency Medicine | Admitting: Emergency Medicine

## 2015-02-18 DIAGNOSIS — E0511 Thyrotoxicosis with toxic single thyroid nodule with thyrotoxic crisis or storm: Secondary | ICD-10-CM | POA: Diagnosis not present

## 2015-02-18 DIAGNOSIS — Z87891 Personal history of nicotine dependence: Secondary | ICD-10-CM | POA: Insufficient documentation

## 2015-02-18 DIAGNOSIS — F419 Anxiety disorder, unspecified: Secondary | ICD-10-CM | POA: Insufficient documentation

## 2015-02-18 DIAGNOSIS — S99921A Unspecified injury of right foot, initial encounter: Secondary | ICD-10-CM | POA: Diagnosis present

## 2015-02-18 DIAGNOSIS — Y998 Other external cause status: Secondary | ICD-10-CM | POA: Diagnosis not present

## 2015-02-18 DIAGNOSIS — Y9289 Other specified places as the place of occurrence of the external cause: Secondary | ICD-10-CM | POA: Insufficient documentation

## 2015-02-18 DIAGNOSIS — Z79899 Other long term (current) drug therapy: Secondary | ICD-10-CM | POA: Insufficient documentation

## 2015-02-18 DIAGNOSIS — S91111A Laceration without foreign body of right great toe without damage to nail, initial encounter: Secondary | ICD-10-CM | POA: Insufficient documentation

## 2015-02-18 DIAGNOSIS — W1841XA Slipping, tripping and stumbling without falling due to stepping on object, initial encounter: Secondary | ICD-10-CM | POA: Diagnosis not present

## 2015-02-18 DIAGNOSIS — S91119A Laceration without foreign body of unspecified toe without damage to nail, initial encounter: Secondary | ICD-10-CM

## 2015-02-18 DIAGNOSIS — Y9301 Activity, walking, marching and hiking: Secondary | ICD-10-CM | POA: Insufficient documentation
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
## 2015-02-18 NOTE — Discharge Instructions (Signed)

## 2015-02-18 NOTE — ED Notes (Signed)
Pt reports walking outside while she stepped on some rocks, got laceration right toe, well edged 1/4 inch lac, bleeding controlled. No anticoagulants intake.

## 2016-05-15 ENCOUNTER — Encounter (HOSPITAL_COMMUNITY): Payer: Self-pay | Admitting: Emergency Medicine

## 2016-05-15 ENCOUNTER — Emergency Department (HOSPITAL_COMMUNITY): Payer: Medicaid Other

## 2016-05-15 ENCOUNTER — Emergency Department (HOSPITAL_COMMUNITY)
Admission: EM | Admit: 2016-05-15 | Discharge: 2016-05-15 | Disposition: A | Discharge status: Home / Self Care | Payer: Medicaid Other | Attending: Emergency Medicine | Admitting: Emergency Medicine

## 2016-05-15 DIAGNOSIS — Z87891 Personal history of nicotine dependence: Secondary | ICD-10-CM | POA: Insufficient documentation

## 2016-05-15 DIAGNOSIS — Z79899 Other long term (current) drug therapy: Secondary | ICD-10-CM | POA: Diagnosis not present

## 2016-05-15 DIAGNOSIS — R1030 Lower abdominal pain, unspecified: Secondary | ICD-10-CM | POA: Diagnosis present

## 2016-05-15 DIAGNOSIS — R112 Nausea with vomiting, unspecified: Secondary | ICD-10-CM | POA: Insufficient documentation

## 2016-05-15 DIAGNOSIS — R109 Unspecified abdominal pain: Secondary | ICD-10-CM

## 2016-05-15 LAB — URINALYSIS, ROUTINE W REFLEX MICROSCOPIC
BACTERIA UA: NONE SEEN
BILIRUBIN URINE: NEGATIVE
GLUCOSE, UA: NEGATIVE mg/dL
HGB URINE DIPSTICK: NEGATIVE
Ketones, ur: NEGATIVE mg/dL
NITRITE: NEGATIVE
PROTEIN: 30 mg/dL — AB
SPECIFIC GRAVITY, URINE: 1.031 — AB (ref 1.005–1.030)
pH: 7 (ref 5.0–8.0)

## 2016-05-15 LAB — CBC
HEMATOCRIT: 37.3 % (ref 36.0–46.0)
HEMOGLOBIN: 12.8 g/dL (ref 12.0–15.0)
MCH: 29.1 pg (ref 26.0–34.0)
MCHC: 34.3 g/dL (ref 30.0–36.0)
MCV: 84.8 fL (ref 78.0–100.0)
Platelets: 243 10*3/uL (ref 150–400)
RBC: 4.4 MIL/uL (ref 3.87–5.11)
RDW: 13.5 % (ref 11.5–15.5)
WBC: 10.2 10*3/uL (ref 4.0–10.5)

## 2016-05-15 LAB — COMPREHENSIVE METABOLIC PANEL
ALK PHOS: 35 U/L — AB (ref 38–126)
ALT: 21 U/L (ref 14–54)
ANION GAP: 6 (ref 5–15)
AST: 24 U/L (ref 15–41)
Albumin: 4.5 g/dL (ref 3.5–5.0)
BILIRUBIN TOTAL: 0.9 mg/dL (ref 0.3–1.2)
BUN: 20 mg/dL (ref 6–20)
CALCIUM: 8.3 mg/dL — AB (ref 8.9–10.3)
CO2: 25 mmol/L (ref 22–32)
Chloride: 106 mmol/L (ref 101–111)
Creatinine, Ser: 0.83 mg/dL (ref 0.44–1.00)
Glucose, Bld: 102 mg/dL — ABNORMAL HIGH (ref 65–99)
POTASSIUM: 4 mmol/L (ref 3.5–5.1)
Sodium: 137 mmol/L (ref 135–145)
TOTAL PROTEIN: 7 g/dL (ref 6.5–8.1)

## 2016-05-15 LAB — I-STAT BETA HCG BLOOD, ED (MC, WL, AP ONLY)

## 2016-05-15 LAB — LIPASE, BLOOD: Lipase: 42 U/L (ref 11–51)

## 2016-05-15 MED ORDER — SODIUM CHLORIDE 0.9 % IV BOLUS (SEPSIS)
1000.0000 mL | Freq: Once | INTRAVENOUS | Status: AC
  Administered 2016-05-15: 1000 mL via INTRAVENOUS

## 2016-05-15 MED ORDER — ONDANSETRON HCL 4 MG PO TABS: 4.0000 mg | ORAL_TABLET | Freq: Four times a day (QID) | ORAL | 0 refills | Status: AC

## 2016-05-15 MED ORDER — ONDANSETRON HCL 4 MG/2ML IJ SOLN
4.0000 mg | Freq: Once | INTRAMUSCULAR | Status: AC
Start: 2016-05-15 — End: 2016-05-15
  Administered 2016-05-15: 4 mg via INTRAVENOUS
  Filled 2016-05-15: qty 2
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
## 2016-05-15 NOTE — ED Triage Notes (Signed)
Patient c/o RUQ pain with n/v that has happened several times. [facility] ([facility] [employer]

## 2016-05-15 NOTE — ED Notes (Signed)
Pt ambulatory and independent at discharge.  Verbalized understanding of discharge instructions 

## 2016-05-15 NOTE — Discharge Instructions (Signed)
Follow-up with your primary care doctor, discuss seeing a gastroenterologist for further evaluation considering the recurrent nature of her symptoms. Return for fever Worsening pain

## 2017-04-17 ENCOUNTER — Other Ambulatory Visit: Payer: Self-pay | Admitting: Obstetrics and Gynecology

## 2017-04-17 DIAGNOSIS — N6320 Unspecified lump in the left breast, unspecified quadrant: Secondary | ICD-10-CM

## 2017-04-30 ENCOUNTER — Other Ambulatory Visit: Payer: Self-pay

## 2017-05-02 ENCOUNTER — Other Ambulatory Visit: Payer: Self-pay | Admitting: Obstetrics and Gynecology

## 2017-05-02 ENCOUNTER — Ambulatory Visit
Admission: RE | Admit: 2017-05-02 | Discharge: 2017-05-02 | Disposition: A | Discharge status: Home / Self Care | Payer: No Typology Code available for payment source | Source: Ambulatory Visit | Attending: Obstetrics and Gynecology | Admitting: Obstetrics and Gynecology

## 2017-05-02 DIAGNOSIS — N6320 Unspecified lump in the left breast, unspecified quadrant: Secondary | ICD-10-CM

## 2017-05-02 IMAGING — US US ABDOMEN COMPLETE
1 series · 14 of 25 positions shown · non-contrast
Comparison: Abdominal ultrasound 06/28/2007.

CLINICAL DATA: Right upper quadrant pain and vomiting for 2 weeks.

EXAM:
ABDOMEN ULTRASOUND COMPLETE

[Series 1: us abdomen complete · 0.17mm/px · 14 of 102 slices shown]
[im 1/102]
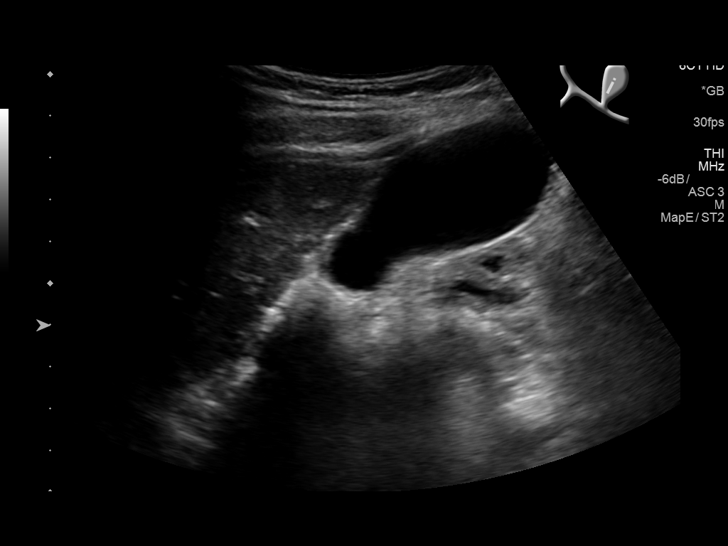
[im 9/102]
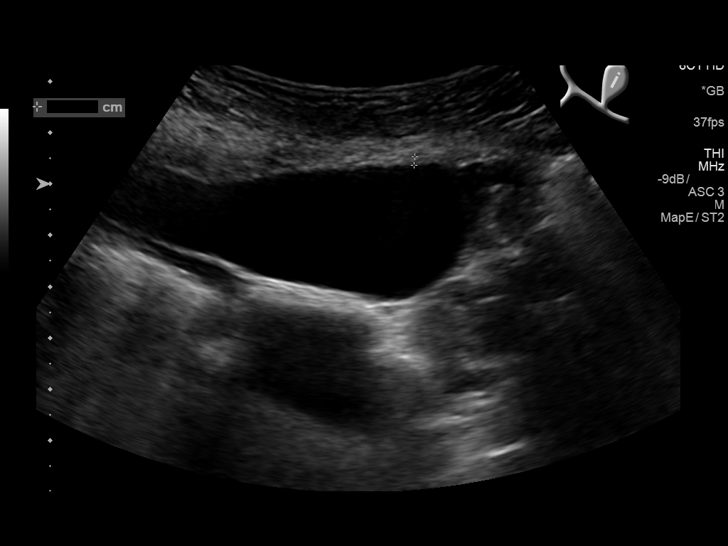
[im 17/102]
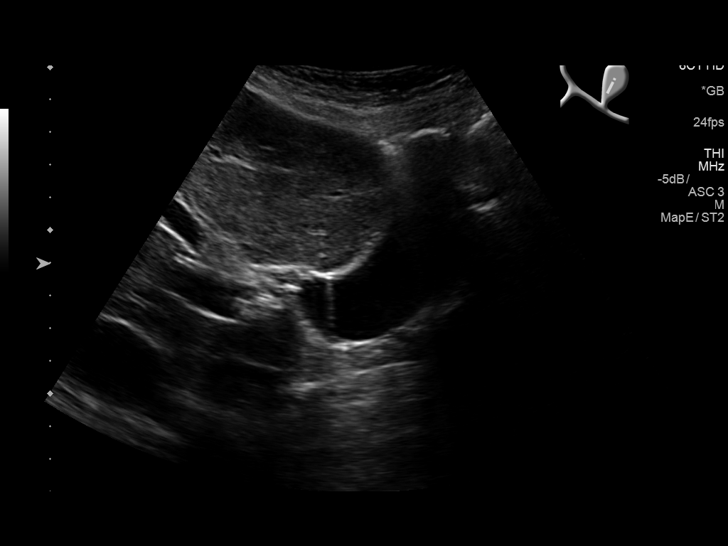
[im 26/102]
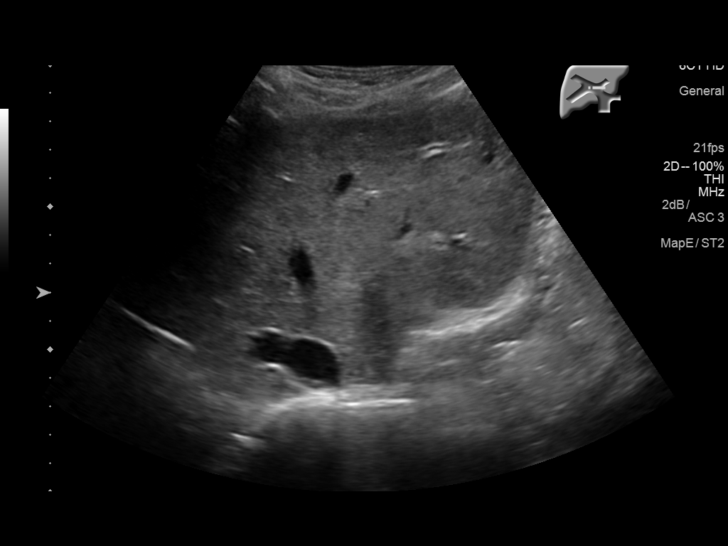
[im 34/102]
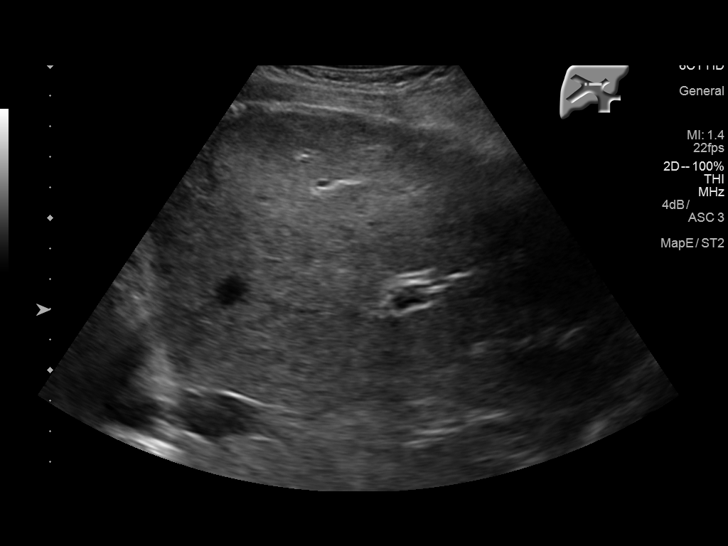
[im 38/102]
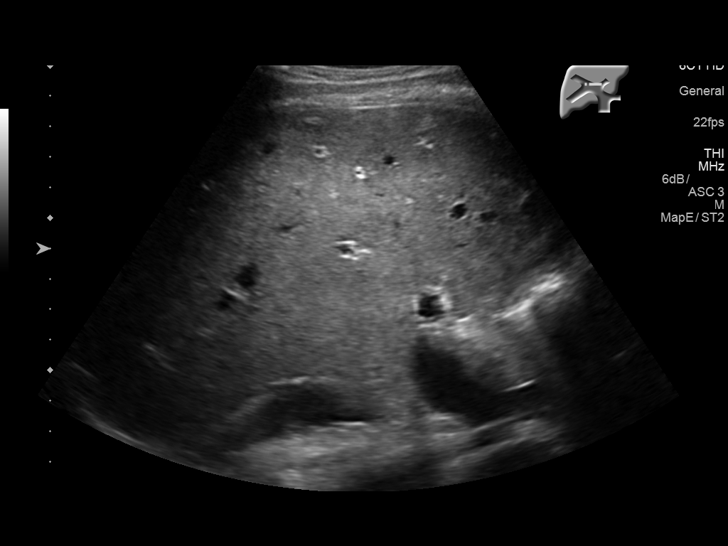
[im 47/102]
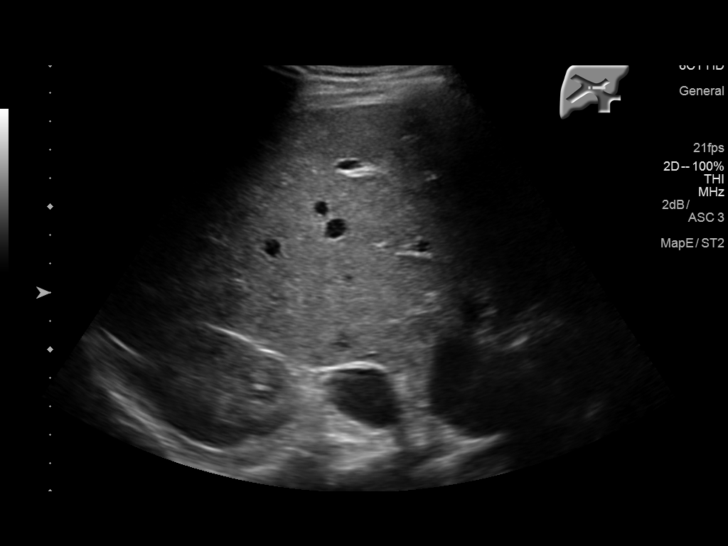
[im 55/102]
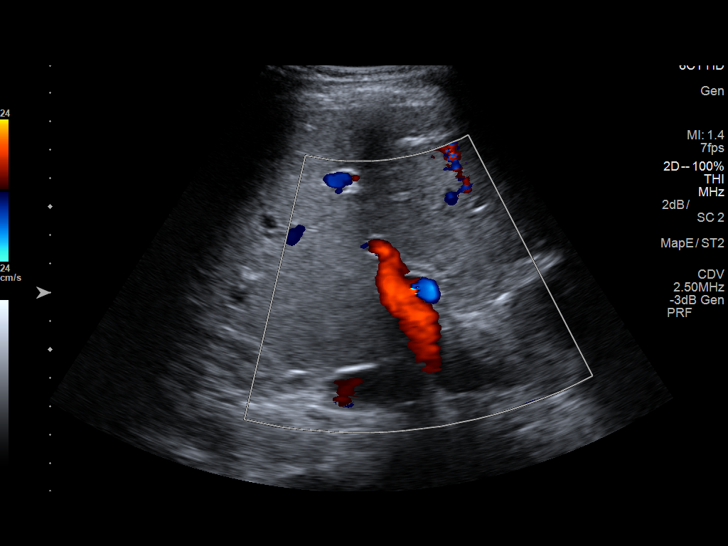
[im 64/102]
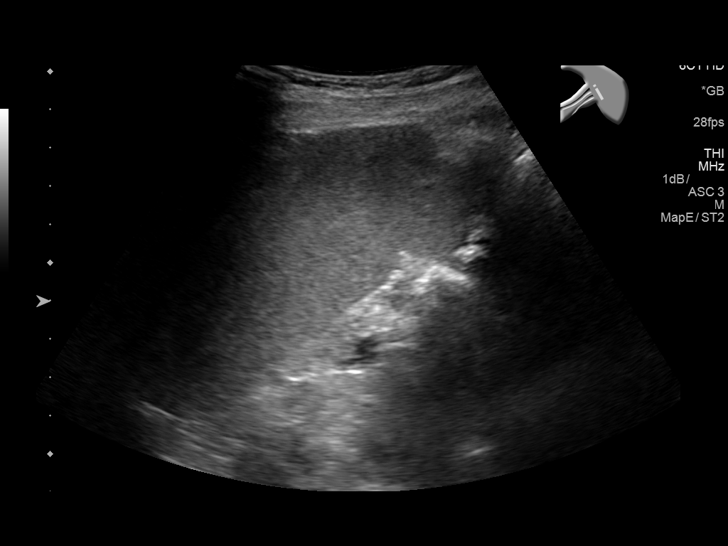
[im 68/102]
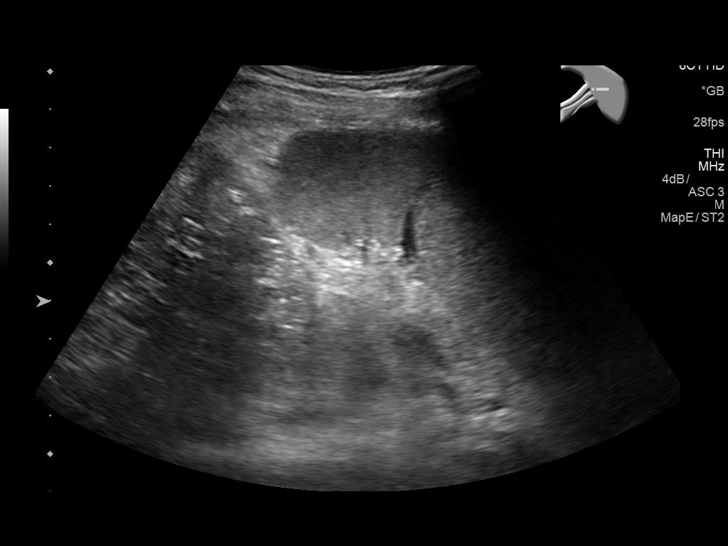
[im 76/102]
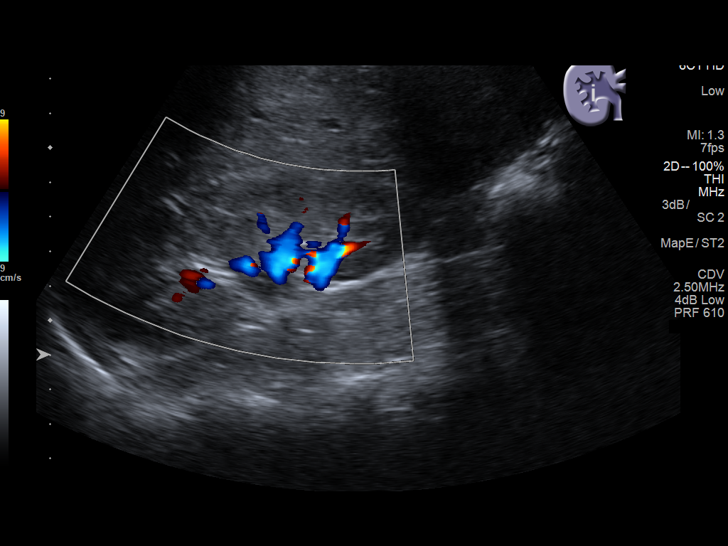
[im 85/102]
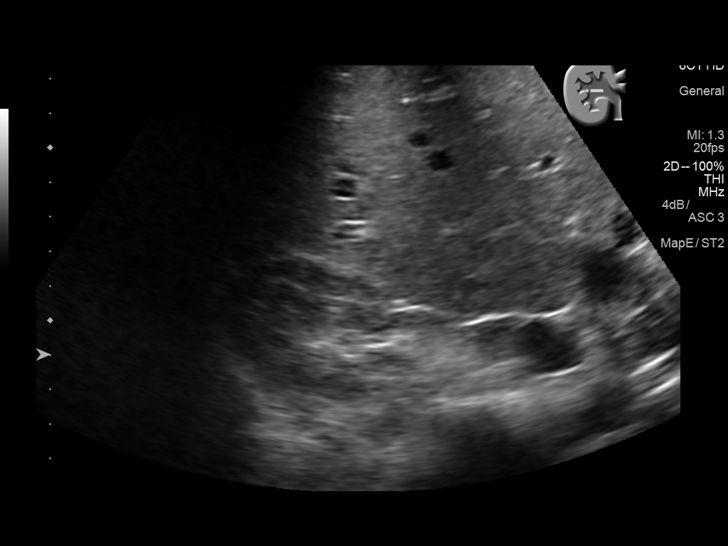
[im 93/102]
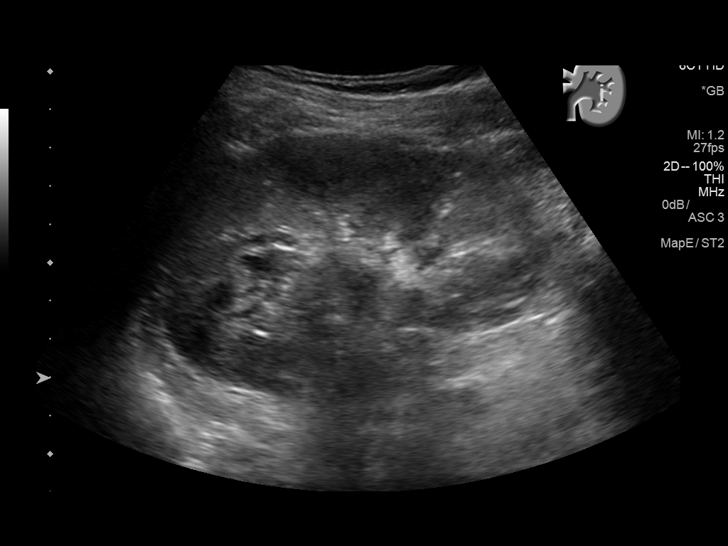
[im 102/102]
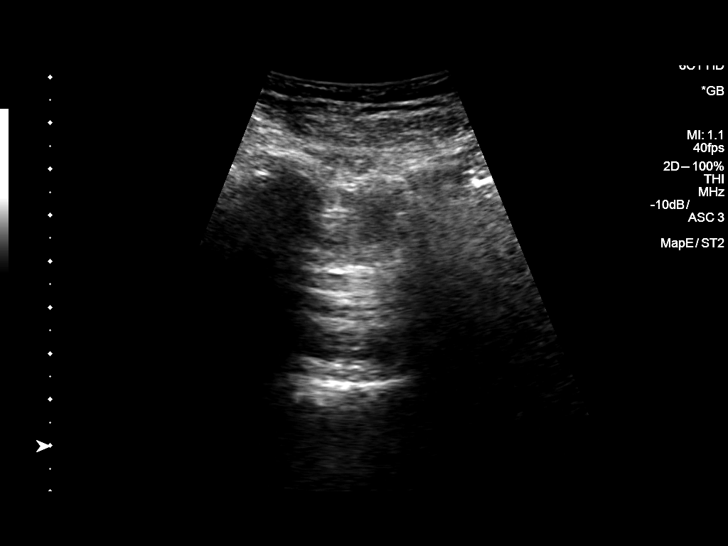

[14 of 25 positions shown; findings below may reference images not displayed]

FINDINGS: Gallbladder: No gallstones or wall thickening visualized. No
sonographic Murphy sign noted by sonographer.

Common bile duct: Diameter: 0.3 cm

Liver: No focal lesion identified. Within normal limits in
parenchymal echogenicity.

IVC: No abnormality visualized.

Pancreas: Visualized portion unremarkable.

Spleen: Size and appearance within normal limits.

Right Kidney: Length: 10.9 cm. Echogenicity within normal limits. No
solid mass or hydronephrosis visualized. Simple cyst measuring
cm noted.

Left Kidney: Length: 10.1 cm. Echogenicity within normal limits. No
mass or hydronephrosis visualized.

Abdominal aorta: No aneurysm visualized.

Other findings: None.
IMPRESSION: No acute finding.  Negative gallbladder.

## 2017-05-07 IMAGING — CR DG TOE GREAT 2+V*R*
3 series · 3 of 3 positions shown · non-contrast
Comparison: 04/06/2011

CLINICAL DATA: Laceration of the medial great toe due to slipping
from a rock stepping stone.

EXAM:
RIGHT GREAT TOE

[x toes ap right]
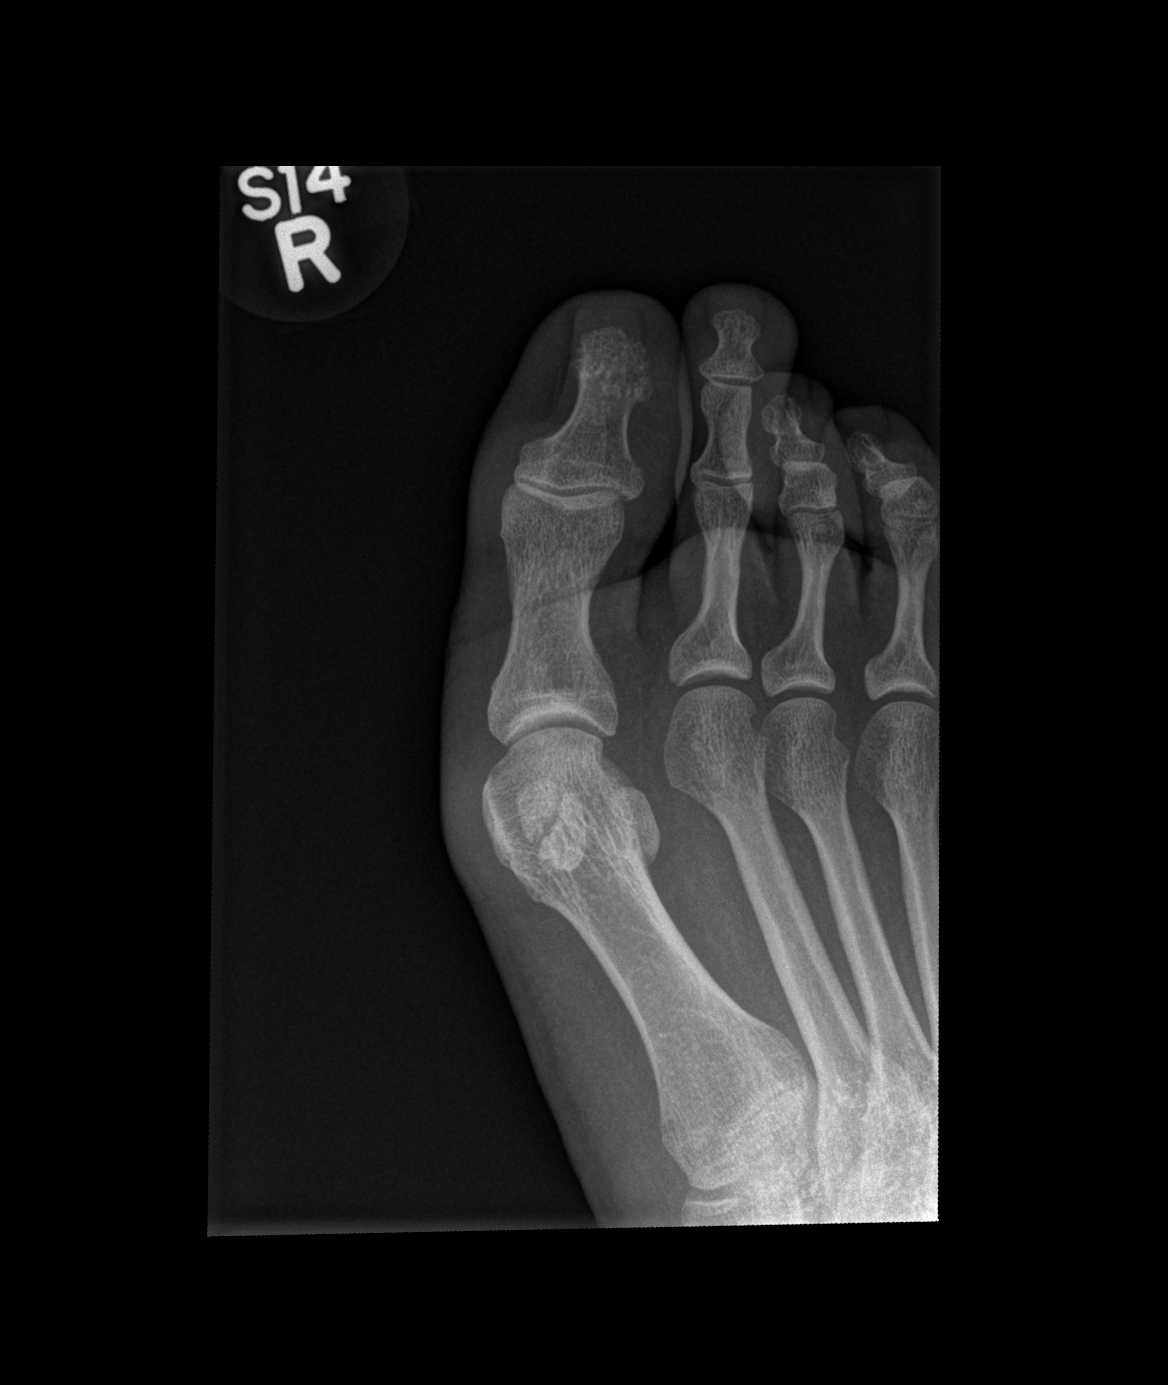

[x toes obl right]
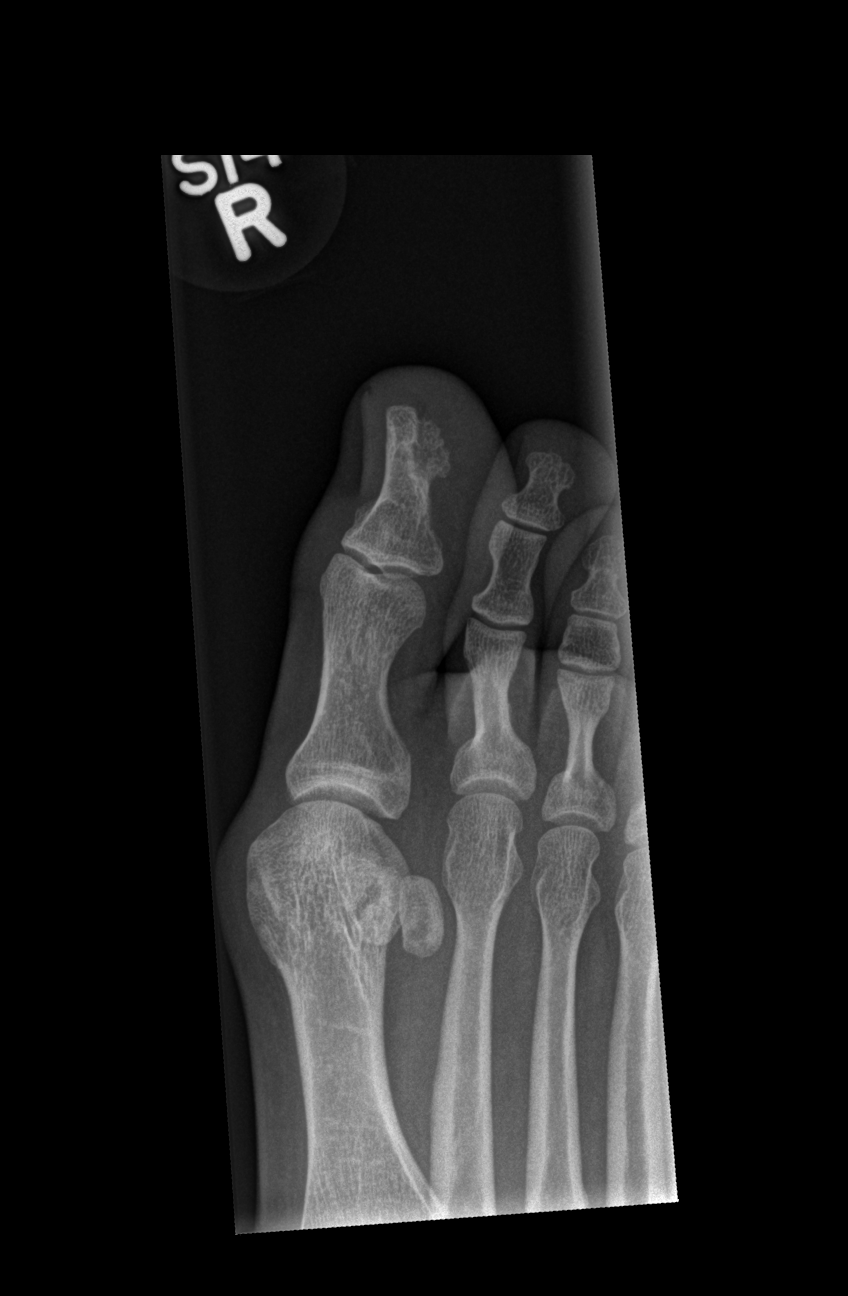

[x toes lat right]
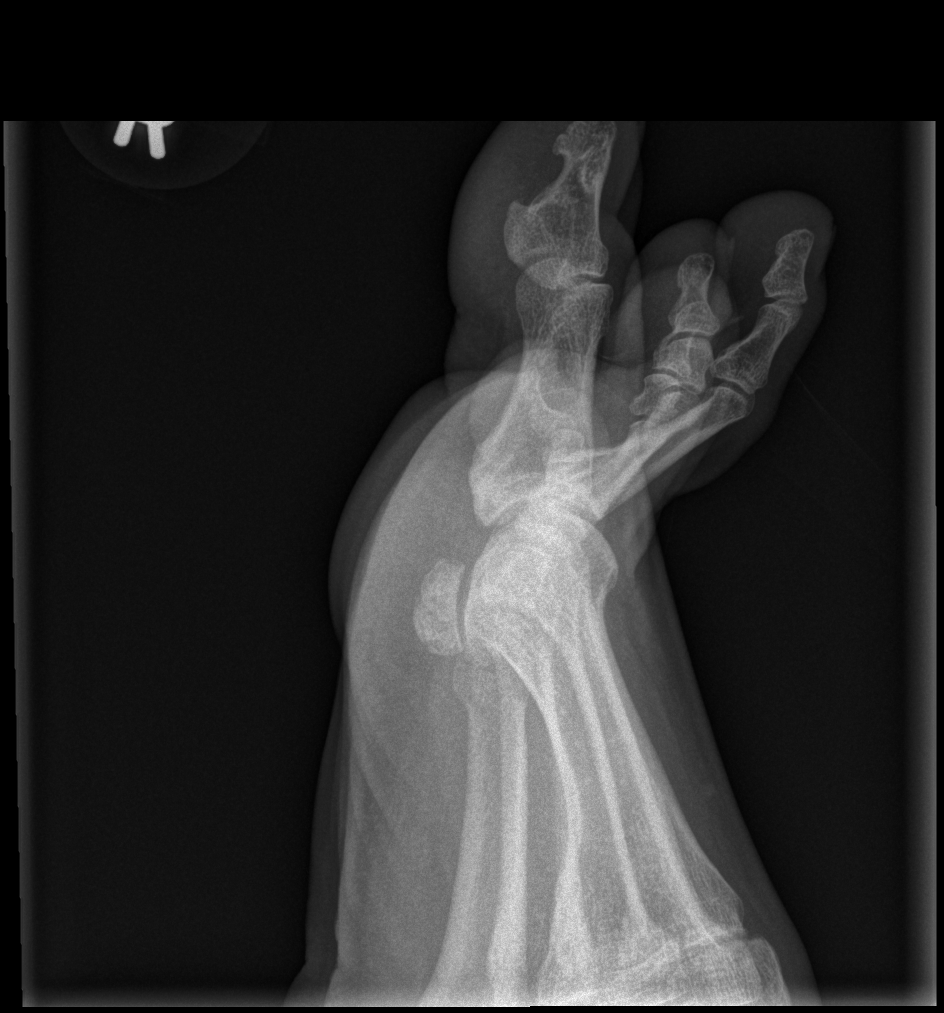

[3 of 3 positions shown; findings below may reference images not displayed]

FINDINGS: Bifid medial first digit sesamoid appears well corticated. No
fracture or dislocation. No foreign body. No acute bony findings.
IMPRESSION: 1.  No significant abnormality identified.

## 2017-06-08 DIAGNOSIS — M25519 Pain in unspecified shoulder: Secondary | ICD-10-CM | POA: Insufficient documentation

## 2017-06-08 DIAGNOSIS — M7918 Myalgia, other site: Secondary | ICD-10-CM | POA: Insufficient documentation

## 2019-01-27 DIAGNOSIS — M25572 Pain in left ankle and joints of left foot: Secondary | ICD-10-CM | POA: Insufficient documentation

## 2019-03-28 ENCOUNTER — Encounter (HOSPITAL_COMMUNITY): Payer: Self-pay | Admitting: Emergency Medicine

## 2019-03-28 ENCOUNTER — Other Ambulatory Visit: Payer: Self-pay

## 2019-03-28 ENCOUNTER — Emergency Department (HOSPITAL_COMMUNITY)
Admission: EM | Admit: 2019-03-28 | Discharge: 2019-03-29 | Disposition: A | Discharge status: Home / Self Care | Payer: Medicaid Other | Attending: Emergency Medicine | Admitting: Emergency Medicine

## 2019-03-28 DIAGNOSIS — L609 Nail disorder, unspecified: Secondary | ICD-10-CM

## 2019-03-28 DIAGNOSIS — Y93H9 Activity, other involving exterior property and land maintenance, building and construction: Secondary | ICD-10-CM | POA: Insufficient documentation

## 2019-03-28 DIAGNOSIS — W458XXD Other foreign body or object entering through skin, subsequent encounter: Secondary | ICD-10-CM | POA: Insufficient documentation

## 2019-03-28 DIAGNOSIS — S60452A Superficial foreign body of right middle finger, initial encounter: Secondary | ICD-10-CM | POA: Insufficient documentation

## 2019-03-28 DIAGNOSIS — Y999 Unspecified external cause status: Secondary | ICD-10-CM | POA: Insufficient documentation

## 2019-03-28 DIAGNOSIS — Y929 Unspecified place or not applicable: Secondary | ICD-10-CM | POA: Insufficient documentation

## 2019-03-28 DIAGNOSIS — Z79899 Other long term (current) drug therapy: Secondary | ICD-10-CM | POA: Insufficient documentation

## 2019-03-28 DIAGNOSIS — Z87891 Personal history of nicotine dependence: Secondary | ICD-10-CM | POA: Insufficient documentation

## 2019-03-28 NOTE — ED Triage Notes (Signed)
Patient reports splinter at right middle finger nailbed sustained while working at a ceiling this afternoon , no bleeding .
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
## 2019-03-29 NOTE — Discharge Instructions (Signed)
Soak your finger several times a day in warm water to try to get the splinter to work its way out. If still not able to remove, can follow-up with hand specialist.  Call office for appt. Return here for new concerns.

## 2019-10-28 NOTE — Progress Notes (Signed)
Presents for pre-employment drug screen. Specimen collected using [employer] Chain of Custody form for [facility] account number [id number]. Specimen ID: [id number]

## 2019-10-29 ENCOUNTER — Other Ambulatory Visit: Payer: Self-pay

## 2019-10-29 DIAGNOSIS — Z0283 Encounter for blood-alcohol and blood-drug test: Secondary | ICD-10-CM

## 2020-04-07 ENCOUNTER — Ambulatory Visit: Payer: Self-pay

## 2020-04-07 DIAGNOSIS — Z23 Encounter for immunization: Secondary | ICD-10-CM

## 2022-05-30 ENCOUNTER — Telehealth: Payer: Self-pay

## 2022-05-30 NOTE — Telephone Encounter (Signed)
No working #. AS, CMA 

## 2022-07-18 DIAGNOSIS — E039 Hypothyroidism, unspecified: Secondary | ICD-10-CM | POA: Diagnosis not present
